# Patient Record
Sex: Male | Born: 1975 | Race: Black or African American | Hispanic: No | Marital: Single | Smoking: Never smoker
Health system: Southern US, Community
[De-identification: ages and names within clinical notes are randomized; demographics above are authoritative.]

---

## 2013-09-14 ENCOUNTER — Encounter (HOSPITAL_BASED_OUTPATIENT_CLINIC_OR_DEPARTMENT_OTHER): Payer: Self-pay | Admitting: Emergency Medicine

## 2013-09-14 ENCOUNTER — Emergency Department (HOSPITAL_BASED_OUTPATIENT_CLINIC_OR_DEPARTMENT_OTHER)
Admission: EM | Admit: 2013-09-14 | Discharge: 2013-09-14 | Disposition: A | Discharge status: Home / Self Care | Payer: BC Managed Care – PPO | Attending: Emergency Medicine | Admitting: Emergency Medicine

## 2013-09-14 DIAGNOSIS — Z008 Encounter for other general examination: Secondary | ICD-10-CM | POA: Insufficient documentation

## 2013-09-14 DIAGNOSIS — Z0000 Encounter for general adult medical examination without abnormal findings: Secondary | ICD-10-CM

## 2013-09-14 NOTE — Discharge Instructions (Signed)
Normal Exam, Adult  You were seen and examined today in our facility. Our caregiver found nothing wrong on the exam. If testing was done such as lab work or x-rays, they did not indicate enough wrong to suggest that treatment should be given. The caregiver then must decide after testing is finished if your concern is a physical problem or illness that needs treatment. Today no treatable problem was found. Even if reassurance was given, if you feel you are getting worse when you get home make sure you call back or return to our department.  For the protection of your privacy, test results can not be given over the phone. Make sure you receive the results of your test. Ask as to how these results are to be obtained if you have not been informed. It is your responsibility to obtain your test results.  Your condition can change over time. Sometimes it takes more than one visit to determine the cause of your symptoms. It is important that you monitor your condition for any changes.  SEEK IMMEDIATE MEDICAL CARE IF:  · You develop an oral temperature above 102° F (38.9° C), which lasts for more than 2 days, not controlled by medications. Only take over-the-counter or prescription medicines for pain, discomfort, or fever as directed by your caregiver.  · You develop a loss of appetite or start throwing up (vomiting).  · You develop a rash, cough, belly (abdominal) pain, earache, headache, or develop pain in neck, muscles, or joints.  · The problem or problems which brought you to our facility become worse or are a cause of more concern.  If we have told you today your exam and tests are normal, and a short while later you feel this is not right, please seek medical attention so you may be rechecked.  Document Released: 11/22/2000 Document Revised: 11/02/2011 Document Reviewed: 03/16/2008  Exit[employer]® Patient Information ©2014 [employer].

## 2013-09-14 NOTE — ED Provider Notes (Signed)
CSN: [id number]     Arrival date & time 09/14/13  2206 History  This chart was scribed for Jared SproutWhitney Dashaun Onstott, MD by Dorothey Basemania Sutton, ED Scribe. This patient was seen in room MH10/MH10 and the patient's care was started at 10:33 PM.    Chief Complaint  Patient presents with  . Circulatory Problem   The history is provided by the patient. No language interpreter was used.   HPI Comments: Jared MartinezMarcus Cole is a 38 y.o. male who presents to the Emergency Department complaining of coldness to the bilateral hands and feet onset around 3.5 hours ago. He reports an associated subjective fever (patient is afebrile at 98.8 in the ED) and states that it has been gradually improving. He states that is co-workers expressed concern for fever/a contagious illness and advised patient to come to the ED for further evaluation. He denies numbness, cough, sore throat, rhinorrhea, myalgias. He denies any regular medication use. He denies history of DM. Patient has no other pertinent medical history. Patient is a non-smoker and an occasional, social drinker.   History reviewed. No pertinent past medical history. History reviewed. No pertinent past surgical history. No family history on file. History  Substance Use Topics  . Smoking status: Never Smoker   . Smokeless tobacco: Not on file  . Alcohol Use: No    Review of Systems  A complete 10 system review of systems was obtained and all systems are negative except as noted in the HPI and PMH.   Allergies  Review of patient's allergies indicates no known allergies.  Home Medications  No current outpatient prescriptions on file.  Triage Vitals: BP 128/88  Pulse 70  Temp(Src) 98.8 F (37.1 C) (Oral)  Resp 16  Ht 6\' 2"  (1.88 m)  Wt 217 lb (98.431 kg)  BMI 27.85 kg/m2  SpO2 100%  Physical Exam  Nursing note and vitals reviewed. Constitutional: He is oriented to person, place, and time. He appears well-developed and well-nourished. No distress.  HENT:  Head:  Normocephalic and atraumatic.  Mouth/Throat: Oropharynx is clear and moist. No oropharyngeal exudate.  Eyes: Conjunctivae are normal.  Neck: Normal range of motion. Neck supple.  Cardiovascular: Normal rate, regular rhythm, normal heart sounds and intact distal pulses.   Pulses:      Dorsalis pedis pulses are 2+ on the right side, and 2+ on the left side.  Pulmonary/Chest: Effort normal and breath sounds normal. No respiratory distress.  Abdominal: Soft. He exhibits no distension. There is no tenderness. There is no rebound and no guarding.  Musculoskeletal: Normal range of motion. He exhibits no edema.  Neurological: He is alert and oriented to person, place, and time.  Skin: Skin is warm and dry.  Psychiatric: He has a normal mood and affect. His behavior is normal.    ED Course  Procedures (including critical care time)  DIAGNOSTIC STUDIES: Oxygen Saturation is 100% on room air, normal by my interpretation.    COORDINATION OF CARE: 10:37 PM- Discussed that there are no signs of illness and that the patient is appropriate for discharge. Discussed treatment plan with patient at bedside and patient verbalized agreement.     Labs Review Labs Reviewed - No data to display Imaging Review No results found.  EKG Interpretation   None       MDM   1. Normal physical exam     Patient presented because he stated that work his hands and feet were cold and some coworkers thought he may have a fever.  However patient has no systemic complaints and states his office was cold in his hands and feet were cold and since he is warmed up he feels fine. His exam is normal his vital signs are normal in given he has no complaints with normal findings the patient was discharged home  I personally performed the services described in this documentation, which was scribed in my presence.  The recorded information has been reviewed and considered.     Jared Sprout, MD 09/14/13 2242

## 2013-09-14 NOTE — ED Notes (Signed)
C/o cold hands and feet onset 1700   Denies pain

## 2013-09-14 NOTE — ED Notes (Addendum)
C/o temp changes to both hands and feet today-was advised by co-workers he has a fver-no temp taken-pt only c/o at this time coldness to both hands and feet-NAD-pt states he was asked to leave work b/c they felt he had a fever and may be contagious

## 2016-12-07 ENCOUNTER — Ambulatory Visit (INDEPENDENT_AMBULATORY_CARE_PROVIDER_SITE_OTHER): Payer: Self-pay

## 2016-12-07 ENCOUNTER — Ambulatory Visit (HOSPITAL_COMMUNITY)
Admission: RE | Admit: 2016-12-07 | Discharge: 2016-12-07 | Disposition: A | Discharge status: Home / Self Care | Payer: BLUE CROSS/BLUE SHIELD | Source: Ambulatory Visit | Attending: Rheumatology | Admitting: Rheumatology

## 2016-12-07 ENCOUNTER — Ambulatory Visit (INDEPENDENT_AMBULATORY_CARE_PROVIDER_SITE_OTHER): Payer: BLUE CROSS/BLUE SHIELD | Admitting: Rheumatology

## 2016-12-07 ENCOUNTER — Encounter: Payer: Self-pay | Admitting: Rheumatology

## 2016-12-07 VITALS — BP 120/74 | HR 78 | Resp 14 | Ht 74.0 in | Wt 198.0 lb

## 2016-12-07 DIAGNOSIS — Z1159 Encounter for screening for other viral diseases: Secondary | ICD-10-CM

## 2016-12-07 DIAGNOSIS — Z1383 Encounter for screening for respiratory disorder NEC: Secondary | ICD-10-CM | POA: Diagnosis not present

## 2016-12-07 DIAGNOSIS — Z111 Encounter for screening for respiratory tuberculosis: Secondary | ICD-10-CM | POA: Diagnosis not present

## 2016-12-07 DIAGNOSIS — M25571 Pain in right ankle and joints of right foot: Secondary | ICD-10-CM | POA: Insufficient documentation

## 2016-12-07 DIAGNOSIS — Z114 Encounter for screening for human immunodeficiency virus [HIV]: Secondary | ICD-10-CM | POA: Diagnosis not present

## 2016-12-07 DIAGNOSIS — M25572 Pain in left ankle and joints of left foot: Secondary | ICD-10-CM | POA: Diagnosis present

## 2016-12-07 DIAGNOSIS — E559 Vitamin D deficiency, unspecified: Secondary | ICD-10-CM

## 2016-12-07 DIAGNOSIS — R5383 Other fatigue: Secondary | ICD-10-CM | POA: Diagnosis not present

## 2016-12-07 DIAGNOSIS — G8929 Other chronic pain: Secondary | ICD-10-CM

## 2016-12-07 NOTE — Progress Notes (Signed)
Office Visit Note  Patient: Jared Cole             Date of Birth: [birth year]           MRN: [id number]             PCP: Pcp Not In System Referring: Lindie Spruce, MD Visit Date: 12/07/2016 Occupation: '@GUAROCC'$ @    Subjective:  Pain of the Right Ankle; Pain of the Left Ankle; and New Patient (Initial Visit)   History of Present Illness: Jared Cole is a 41 y.o. male seen in consultation per request of his PCP. According to patient in January 2018 he started having pain in his bilateral ankles. Gradually the pain got worse and he started having increased swelling. The swelling was initially localized to his ankles and then it moved to his feet. He states he went to his PCP and had x-rays of his bilateral ankles which were normal. He he was working part-time initially and then had to quit working because of increased swelling in his bilateral feet. He is also noticed some discoloration on his bilateral lower extremities. He states he was given meloxicam 15 mg by mouth daily which cause gastritis and weight loss. He has appointment coming up with a gastroenterologist. He denies discomfort in any other joints. He denies any shortness of breath but he gives history of fatigue. Swelling gets worse during the day. Patient gives history of some diarrhea and weight loss which is started after taking Mobic.  Activities of Daily Living:  Patient reports morning stiffness for 0 minute.   Patient Denies nocturnal pain.  Difficulty dressing/grooming: Denies Difficulty climbing stairs: Denies Difficulty getting out of chair: Denies Difficulty using hands for taps, buttons, cutlery, and/or writing: Denies   Review of Systems  Constitutional: Positive for fatigue. Negative for night sweats and weakness ( ).  HENT: Negative for mouth sores, mouth dryness and nose dryness.   Eyes: Negative for redness and dryness.  Respiratory: Negative for shortness of breath and difficulty breathing.     Cardiovascular: Negative for chest pain, palpitations, hypertension, irregular heartbeat and swelling in legs/feet.  Gastrointestinal: Positive for heartburn. Negative for constipation and diarrhea.  Endocrine: Negative for increased urination.  Musculoskeletal: Positive for arthralgias, joint pain and joint swelling. Negative for myalgias, muscle weakness, morning stiffness, muscle tenderness and myalgias.  Skin: Negative for color change, rash, hair loss, nodules/bumps, skin tightness, ulcers and sensitivity to sunlight.       Discoloration  Allergic/Immunologic: Negative for susceptible to infections.  Neurological: Negative for dizziness, fainting, memory loss and night sweats.  Hematological: Negative for swollen glands.  Psychiatric/Behavioral: Positive for depressed mood. Negative for sleep disturbance. The patient is nervous/anxious.        Situational    PMFS History:  There are no active problems to display for this patient.   History reviewed. No pertinent past medical history.  No family history on file. History reviewed. No pertinent surgical history. Social History   Social History Narrative  . No narrative on file     Objective: Vital Signs: BP 120/74 (BP Location: Right Arm)   Pulse 78   Resp 14   Ht '6\' 2"'$  (1.88 m)   Wt 198 lb (89.8 kg)   BMI 25.42 kg/m    Physical Exam  Constitutional: He is oriented to person, place, and time. He appears well-developed and well-nourished.  HENT:  Head: Normocephalic and atraumatic.  Eyes: Conjunctivae and EOM are normal. Pupils are equal, round, and reactive  to light. Left eye exhibits no discharge.  Neck: Normal range of motion. Neck supple.  Cardiovascular: Normal rate, regular rhythm and normal heart sounds.   Pulmonary/Chest: Effort normal and breath sounds normal.  Abdominal: Soft. Bowel sounds are normal.  Neurological: He is alert and oriented to person, place, and time.  Skin: Skin is warm and dry. Capillary  refill takes less than 2 seconds. Rash noted.  Palpable tender erythematous nodules with hyperpigmentation noted on bilateral lower extremities consistent with possible erythema nodosum  Psychiatric: He has a normal mood and affect. His behavior is normal.  Nursing note and vitals reviewed.    Musculoskeletal Exam: C-spine and thoracic lumbar spine good range of motion. Shoulder joints elbow joints wrist joint MCPs PIPs DIPs with good range of motion. Hip joints knee joints are good range of motion. He has some discomfort range of motion of bilateral ankle joints is swelling and tenderness on palpation over bilateral ankle joints and dorsum of foot. No MTP joint tenderness was noted.  CDAI Exam: No CDAI exam completed.    Investigation: No additional findings.   Imaging: Xr Ankle Complete Right  Result Date: 12/07/2016 No ankle joint space was noted. No chondrocalcinosis was noted. No erosive changes were noted. Impression normal x-ray of the right foot  Xr Foot 2 Views Right  Result Date: 12/07/2016 No MTP PIP/DIP joint space narrowing was noted. No intertarsal joint space narrowing was noted. No erosive changes were noted. Impression: Normal x-ray of the right foot   Speciality Comments: No specialty comments available.    Procedures:  No procedures performed Allergies: Patient has no known allergies.   Assessment / Plan:     Visit Diagnoses: Chronic pain of both ankles -pain and swelling of bilateral ankle joints going on since some January 2018. There is no other joint involvement. 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 Differential includes inflammatory arthritis like sarcoidosis or other autoimmune disease. I will obtain following x-rays including a chest x-ray. Following labs will be obtained today to look for underlying autoimmune process. 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 I will refer him to dermatology for biopsy of the nodule.  He gives history of diarrhea and weight loss which is started after Mobic although it raises the concern of possible underlying IBD. Patient has an appointment with GI next Wednesday. I would hold off any treatment until he had GI evaluation.  Vitamin D deficiency    Orders: Orders Placed This Encounter  Procedures  . XR Ankle Complete Left  . XR Ankle Complete Right  . XR Foot 2 Views Left  . XR Foot 2 Views Right  . DG Chest 2 View  . CBC with Differential/Platelet  . COMPLETE METABOLIC PANEL WITH GFR  . Sedimentation rate  . CK  . TSH  . ANA  . Rheumatoid factor  . Angiotensin converting enzyme  . Pan-ANCA  . HLA-B27 antigen  . Hepatitis panel, acute  . HIV antibody  . Quantiferon tb gold assay (blood)  . Serum protein electrophoresis with reflex  . IgG, IgA, IgM  . Ambulatory referral to Dermatology   No orders of the defined types were placed in this encounter.   Face-to-face time spent with patient was 50 minutes. 50% of time was spent in counseling and coordination of care.  Follow-Up Instructions: Return for Pain and swelling ankle joints.  Bo Merino, MD  Note - This record has been created using Editor, commissioning.  Chart creation errors have been sought, but may not always  have been located. Such creation errors do not reflect on  the standard of medical care.

## 2016-12-07 NOTE — Progress Notes (Signed)
CXR normal.

## 2016-12-08 LAB — PAN-ANCA: ANCA SCREEN: NEGATIVE

## 2016-12-08 LAB — CBC WITH DIFFERENTIAL/PLATELET

## 2016-12-08 LAB — COMPLETE METABOLIC PANEL WITH GFR
ALT: 17 U/L (ref 9–46)
AST: 15 U/L (ref 10–40)
Albumin: 3.6 g/dL (ref 3.6–5.1)
Alkaline Phosphatase: 74 U/L (ref 40–115)
BUN: 5 mg/dL — AB (ref 7–25)
CALCIUM: 9.2 mg/dL (ref 8.6–10.3)
CHLORIDE: 100 mmol/L (ref 98–110)
CO2: 20 mmol/L (ref 20–31)
CREATININE: 0.8 mg/dL (ref 0.60–1.35)
GFR, Est African American: >89 mL/min (ref >=60)
GFR, Est Non African American: >89 mL/min (ref >=60)
Glucose, Bld: 118 mg/dL — ABNORMAL HIGH (ref 65–99)
POTASSIUM: 4.2 mmol/L (ref 3.5–5.3)
Sodium: 139 mmol/L (ref 135–146)
Total Bilirubin: 0.8 mg/dL (ref 0.2–1.2)
Total Protein: 7.2 g/dL (ref 6.1–8.1)

## 2016-12-08 LAB — HIV ANTIBODY (ROUTINE TESTING W REFLEX): HIV: NONREACTIVE

## 2016-12-08 LAB — RHEUMATOID FACTOR

## 2016-12-08 LAB — HEPATITIS PANEL, ACUTE
HCV Ab: NEGATIVE
Hep A IgM: NONREACTIVE
Hep B C IgM: NONREACTIVE
Hepatitis B Surface Ag: NEGATIVE

## 2016-12-08 LAB — TSH: TSH: 0.68 mIU/L (ref 0.40–4.50)

## 2016-12-08 LAB — IGG, IGA, IGM
IgA: 145 mg/dL (ref 81–463)
IgG (Immunoglobin G), Serum: 1639 mg/dL — ABNORMAL HIGH (ref 694–1618)
IgM, Serum: 138 mg/dL (ref 48–271)

## 2016-12-08 LAB — CK: Total CK: 183 U/L (ref 7–232)

## 2016-12-08 LAB — SEDIMENTATION RATE: Sed Rate: 38 mm/hr — ABNORMAL HIGH (ref 0–15)

## 2016-12-08 LAB — ANA: ANA: NEGATIVE

## 2016-12-08 LAB — ANGIOTENSIN CONVERTING ENZYME: Angiotensin-Converting Enzyme: 17 U/L (ref 9–67)

## 2016-12-09 LAB — PROTEIN ELECTROPHORESIS, SERUM, WITH REFLEX
ALPHA-2-GLOBULIN: 1.3 g/dL — AB (ref 0.5–0.9)
Albumin ELP: 3.3 g/dL — ABNORMAL LOW (ref 3.8–4.8)
Alpha-1-Globulin: 0.5 g/dL — ABNORMAL HIGH (ref 0.2–0.3)
BETA 2: 0.4 g/dL (ref 0.2–0.5)
BETA GLOBULIN: 0.3 g/dL — AB (ref 0.4–0.6)
GAMMA GLOBULIN: 1.4 g/dL (ref 0.8–1.7)
Total Protein, Serum Electrophoresis: 7.2 g/dL (ref 6.1–8.1)

## 2016-12-09 LAB — QUANTIFERON TB GOLD ASSAY (BLOOD)
INTERFERON GAMMA RELEASE ASSAY: NEGATIVE
MITOGEN-NIL SO: 3.63 [IU]/mL
QUANTIFERON NIL VALUE: 0.04 [IU]/mL
Quantiferon Tb Ag Minus Nil Value: <0 IU/mL

## 2016-12-14 LAB — HLA-B27 ANTIGEN: DNA Result:: NEGATIVE

## 2016-12-14 NOTE — Progress Notes (Signed)
WNL

## 2017-01-05 NOTE — Progress Notes (Deleted)
   Office Visit Note  Patient: Jared Cole             Date of Birth: 04/04/1976           MRN: [id number]             PCP: System, Pcp Not In Referring: No ref. provider found Visit Date: 01/12/2017 Occupation: '@GUAROCC'$ @    Subjective:  No chief complaint on file.   History of Present Illness: Jared Cole is a 41 y.o. male ***   Activities of Daily Living:  Patient reports morning stiffness for *** {minute/hour:19697}.   Patient {ACTIONS;DENIES/REPORTS:21021675::"Denies"} nocturnal pain.  Difficulty dressing/grooming: {ACTIONS;DENIES/REPORTS:21021675::"Denies"} Difficulty climbing stairs: {ACTIONS;DENIES/REPORTS:21021675::"Denies"} Difficulty getting out of chair: {ACTIONS;DENIES/REPORTS:21021675::"Denies"} Difficulty using hands for taps, buttons, cutlery, and/or writing: {ACTIONS;DENIES/REPORTS:21021675::"Denies"}   No Rheumatology ROS completed.   PMFS History:  There are no active problems to display for this patient.   No past medical history on file.  No family history on file. No past surgical history on file. Social History   Social History Narrative  . No narrative on file     Objective: Vital Signs: There were no vitals taken for this visit.   Physical Exam   Musculoskeletal Exam: ***  CDAI Exam: No CDAI exam completed.    Investigation: Findings:  12/07/2016 CMP,IgG, IgA, IgM, ANA, ACE, CK,Hepatitis panel, acute ,HIV antibody ,HLA-B27,Pan-ANCA TB Gold, RF, and TSH are normal   SPEP One or more serum protein fractions are outside the normal ranges. No abnormal protein bands are apparent. ESR elevated 38.  CBC Cancelled, specimen received was clotted.     Imaging: Dg Chest 2 View  Result Date: 12/07/2016 CLINICAL DATA:  Bilateral chronic ankle pain EXAM: CHEST  2 VIEW COMPARISON:  None. FINDINGS: The heart size and mediastinal contours are within normal limits. Both lungs are clear. The visualized skeletal structures are unremarkable.  IMPRESSION: No active cardiopulmonary disease. Electronically Signed   By: Kathreen Devoid   On: 12/07/2016 13:21   Xr Ankle Complete Right  Result Date: 12/07/2016 No ankle joint space was noted. No chondrocalcinosis was noted. No erosive changes were noted. Impression normal x-ray of the right foot  Xr Foot 2 Views Right  Result Date: 12/07/2016 No MTP PIP/DIP joint space narrowing was noted. No intertarsal joint space narrowing was noted. No erosive changes were noted. Impression: Normal x-ray of the right foot   Speciality Comments: No specialty comments available.    Procedures:  No procedures performed Allergies: Patient has no known allergies.   Assessment / Plan:     Visit Diagnoses: Other fatigue  Chronic ankle pain, bilateral    Orders: No orders of the defined types were placed in this encounter.  No orders of the defined types were placed in this encounter.   Face-to-face time spent with patient was *** minutes. 50% of time was spent in counseling and coordination of care.  Follow-Up Instructions: No Follow-up on file.   [name], RT  Note - This record has been created using [employer].  Chart creation errors have been sought, but may not always  have been located. Such creation errors do not reflect on  the standard of medical care.

## 2017-01-06 ENCOUNTER — Ambulatory Visit: Payer: Self-pay | Admitting: Rheumatology

## 2017-01-12 ENCOUNTER — Ambulatory Visit: Payer: Self-pay | Admitting: Rheumatology

## 2017-01-22 DIAGNOSIS — G8929 Other chronic pain: Secondary | ICD-10-CM | POA: Insufficient documentation

## 2017-01-22 DIAGNOSIS — R5383 Other fatigue: Secondary | ICD-10-CM | POA: Insufficient documentation

## 2017-01-22 DIAGNOSIS — M25572 Pain in left ankle and joints of left foot: Principal | ICD-10-CM

## 2017-01-22 DIAGNOSIS — E559 Vitamin D deficiency, unspecified: Secondary | ICD-10-CM | POA: Insufficient documentation

## 2017-01-22 DIAGNOSIS — M25571 Pain in right ankle and joints of right foot: Principal | ICD-10-CM

## 2017-01-22 NOTE — Progress Notes (Signed)
Office Visit Note  Patient: Jared Cole             Date of Birth: May 12, 1976           MRN: [id number]             PCP: System, Pcp Not In Referring: No ref. provider found Visit Date: 01/27/2017 Occupation: @GUAROCC @    Subjective:  Ankle joint pain and swelling   History of Present Illness: Jared Cole is a 41 y.o. male with history of pain and swelling of bilateral ankle joints. He states gradually symptoms got better and the swelling resolved. He also had subcutaneous nodules on his lower extremities for which he went for a skin biopsy. He also had GI workup by Dr. Ferdinand Lango at [facility] in [place]. He states the endoscopy revealed gastric ulcer and he was placed on omeprazole. He continues to have loose stools and some abdominal bloating. He has not had a colonoscopy yet. None of the other joints are painful. Patient states that he also saw urologist who found that his prostate was enlarged and he had mild proteinuria.  Activities of Daily Living:  Patient reports morning stiffness for 0 minutes.   Patient Denies nocturnal pain.  Difficulty dressing/grooming: Denies Difficulty climbing stairs: Denies Difficulty getting out of chair: Denies Difficulty using hands for taps, buttons, cutlery, and/or writing: Denies   Review of Systems  Constitutional: Negative for fatigue, night sweats and weakness ( ).  HENT: Negative for mouth sores, mouth dryness and nose dryness.   Eyes: Negative for redness and dryness.  Respiratory: Negative for shortness of breath and difficulty breathing.   Cardiovascular: Negative for chest pain, palpitations, hypertension, irregular heartbeat and swelling in legs/feet.  Gastrointestinal: Positive for diarrhea. Negative for constipation.  Endocrine: Negative for increased urination.  Musculoskeletal: Negative for arthralgias, joint pain, joint swelling, myalgias, muscle weakness, morning stiffness, muscle tenderness and myalgias.  Skin: Positive  for rash. Negative for color change, hair loss, nodules/bumps, skin tightness, ulcers and sensitivity to sunlight.  Allergic/Immunologic: Negative for susceptible to infections.  Neurological: Negative for dizziness, fainting, memory loss and night sweats.  Hematological: Negative for swollen glands.  Psychiatric/Behavioral: Negative for depressed mood and sleep disturbance. The patient is not nervous/anxious.     PMFS History:  Patient Active Problem List   Diagnosis Date Noted  . Other fatigue 01/22/2017  . Chronic pain of both ankles 01/22/2017  . Vitamin D deficiency 01/22/2017    No past medical history on file.  No family history on file. No past surgical history on file. Social History   Social History Narrative  . No narrative on file     Objective: Vital Signs: BP 128/76   Pulse 80   Resp 14   Wt 183 lb (83 kg)   BMI 23.50 kg/m    Physical Exam  Constitutional: He is oriented to person, place, and time. He appears well-developed and well-nourished.  HENT:  Head: Normocephalic and atraumatic.  Eyes: Conjunctivae and EOM are normal. Pupils are equal, round, and reactive to light.  Neck: Normal range of motion. Neck supple.  Cardiovascular: Normal rate, regular rhythm and normal heart sounds.   Pulmonary/Chest: Effort normal and breath sounds normal.  Abdominal: Soft. Bowel sounds are normal.  Neurological: He is alert and oriented to person, place, and time.  Skin: Skin is warm and dry. Capillary refill takes less than 2 seconds.  Hyperpigmented area over bilateral lower extremities. He had subcutaneous nodules, no active no  diagnosis was noted.  Psychiatric: He has a normal mood and affect. His behavior is normal.  Nursing note and vitals reviewed.    Musculoskeletal Exam: C-spine and thoracic lumbar spine good range of motion. Shoulder joints elbow joints wrist joint MCPs PIPs DIPs are good range of motion. Hip joints knee joints ankle joints are good range of  motion. He has some fullness over bilateral ankle joints but no tenderness on examination.  CDAI Exam: No CDAI exam completed.    Investigation: Findings:  CMP with GFR non fasting glucose 118, IgG 1,639  IgA 145 IgM 138, Sedimentation rate 38, Serum protein electrophoresis with reflex One or more serum protein fractions are outside the normal ranges. No abnormal protein bands are apparent. ANA negative,Angiotensin converting enzyme normal  ,CBC with Differential/Platelet normal ,CK normal,Hepatitis panel, acute negative/ non reactive,HIV antibody non reactive,HLA-B27 antigen negative Pan-ANCA negative,Quantiferon tb gold assay (blood) negative,Rheumatoid factor normal, and TSH normal   Patient brought x-ray of his left ankle joint today which I reviewed and it was within normal limits    Imaging: No results found.  Speciality Comments: No specialty comments available.    Procedures:  No procedures performed Allergies: Patient has no known allergies.   Assessment / Plan:     Visit Diagnoses: Chronic pain of both ankles - high ESR, all other autoimmune workup negative, chest x-ray normal. He still continues to have some swelling without any discomfort. He also brought the x-ray of his left ankle which I reviewed and it was within normal limits. As he is having ongoing ankle joint swelling and had panniculitis, I discussed possible option of using Plaquenil. At this point he would like to hold off as his symptoms are getting better. He'll get his complete GI workup and then we can make decision at follow-up visit. I've given him a handout on Plaquenil. If he has persistent ankle joint swelling and may consider Plaquenil otherwise colchicine could be an option just for the nodules.  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 All autoimmune workup is negative. He's been using compression hoses which  is been helpful. Other medications which are being used for the treatment has been potassium iodide 360-900 mg per day. Or colchicine or steroids sparing agents. There is usually a strong association with tuberculosis his TB gold was negative.  Vitamin D deficiency  Diarrhea, unspecified type - GI workup : Patient had endoscopy and was placed on omeprazole for gastric ulcer per patient a do not have those records to review. He did not get colonoscopy yet. I recommended having a colonoscopy to look for any underlying inflammatory bowel disease.    Orders: Orders Placed This Encounter  Procedures  . CBC with Differential/Platelet   No orders of the defined types were placed in this encounter.   Face-to-face time spent with patient was 30 minutes. 50% of time was spent in counseling and coordination of care.  Follow-Up Instructions: Return in about 2 months (around 03/29/2017) for Pain ankles, EN.   Bo Merino, MD  Note - This record has been created using Editor, commissioning.  Chart creation errors have been sought, but may not always  have been located. Such creation errors do not reflect on  the standard of medical care.

## 2017-01-27 ENCOUNTER — Encounter (INDEPENDENT_AMBULATORY_CARE_PROVIDER_SITE_OTHER): Payer: Self-pay

## 2017-01-27 ENCOUNTER — Encounter: Payer: Self-pay | Admitting: Rheumatology

## 2017-01-27 ENCOUNTER — Ambulatory Visit (INDEPENDENT_AMBULATORY_CARE_PROVIDER_SITE_OTHER): Payer: BLUE CROSS/BLUE SHIELD | Admitting: Rheumatology

## 2017-01-27 VITALS — BP 128/76 | HR 80 | Resp 14 | Wt 183.0 lb

## 2017-01-27 DIAGNOSIS — M25572 Pain in left ankle and joints of left foot: Secondary | ICD-10-CM

## 2017-01-27 DIAGNOSIS — M25571 Pain in right ankle and joints of right foot: Secondary | ICD-10-CM | POA: Diagnosis not present

## 2017-01-27 DIAGNOSIS — R229 Localized swelling, mass and lump, unspecified: Secondary | ICD-10-CM

## 2017-01-27 DIAGNOSIS — R197 Diarrhea, unspecified: Secondary | ICD-10-CM | POA: Diagnosis not present

## 2017-01-27 DIAGNOSIS — G8929 Other chronic pain: Secondary | ICD-10-CM | POA: Diagnosis not present

## 2017-01-27 DIAGNOSIS — E559 Vitamin D deficiency, unspecified: Secondary | ICD-10-CM

## 2017-01-27 DIAGNOSIS — R5383 Other fatigue: Secondary | ICD-10-CM

## 2017-01-27 LAB — CBC WITH DIFFERENTIAL/PLATELET
BASOS ABS: 0 {cells}/uL (ref 0–200)
Basophils Relative: 0 %
EOS ABS: 240 {cells}/uL (ref 15–500)
EOS PCT: 4 %
HEMATOCRIT: 41.3 % (ref 38.5–50.0)
HEMOGLOBIN: 13.5 g/dL (ref 13.2–17.1)
Lymphocytes Relative: 15 %
Lymphs Abs: 900 cells/uL (ref 850–3900)
MCH: 26.3 pg — AB (ref 27.0–33.0)
MCHC: 32.7 g/dL (ref 32.0–36.0)
MCV: 80.5 fL (ref 80.0–100.0)
MPV: 8.6 fL (ref 7.5–12.5)
Monocytes Absolute: 660 cells/uL (ref 200–950)
Monocytes Relative: 11 %
NEUTROS ABS: 4200 {cells}/uL (ref 1500–7800)
NEUTROS PCT: 70 %
Platelets: 403 10*3/uL — ABNORMAL HIGH (ref 140–400)
RBC: 5.13 MIL/uL (ref 4.20–5.80)
RDW: 15.4 % — ABNORMAL HIGH (ref 11.0–15.0)
WBC: 6 10*3/uL (ref 3.8–10.8)

## 2017-01-27 NOTE — Patient Instructions (Signed)
Standing Labs We placed an order today for your standing lab work.    Please come back and get your standing labs in 1 month, then 3 months  We have open lab Monday through Friday from 8:30-11:30 AM and 1:30-4 PM at the office of Dr. Arbutus PedShaili Deveshwar/Naitik Panwala, PA.   The office is located at [address], [address], [place], [place] [address] No appointment is necessary.   Labs are drawn by [facility].  You may receive a bill from [facility] for your lab work. If you have any questions regarding directions or hours of operation,  please call [phone].    Hydroxychloroquine tablets What is this medicine? HYDROXYCHLOROQUINE (hye drox ee KLOR oh kwin) is used to treat rheumatoid arthritis and systemic lupus erythematosus. It is also used to treat malaria. This medicine may be used for other purposes; ask your health care provider or pharmacist if you have questions. COMMON BRAND NAME(S): Plaquenil, Quineprox What should I tell my health care provider before I take this medicine? They need to know if you have any of these conditions: -diabetes -eye disease, vision problems -G6PD deficiency -history of blood diseases -history of irregular heartbeat -if you often drink alcohol -kidney disease -liver disease -porphyria -psoriasis -seizures -an unusual or allergic reaction to chloroquine, hydroxychloroquine, other medicines, foods, dyes, or preservatives -pregnant or trying to get pregnant -breast-feeding How should I use this medicine? Take this medicine by mouth with a glass of water. Follow the directions on the prescription label. Avoid taking antacids within 4 hours of taking this medicine. It is best to separate these medicines by at least 4 hours. Do not cut, crush or chew this medicine. You can take it with or without food. If it upsets your stomach, take it with food. Take your medicine at regular intervals. Do not take your medicine more often than directed. Take all of your  medicine as directed even if you think you are better. Do not skip doses or stop your medicine early. Talk to your pediatrician regarding the use of this medicine in children. While this drug may be prescribed for selected conditions, precautions do apply. Overdosage: If you think you have taken too much of this medicine contact a poison control center or emergency room at once. NOTE: This medicine is only for you. Do not share this medicine with others. What if I miss a dose? If you miss a dose, take it as soon as you can. If it is almost time for your next dose, take only that dose. Do not take double or extra doses. What may interact with this medicine? Do not take this medicine with any of the following medications: -cisapride -dofetilide -dronedarone -live virus vaccines -penicillamine -pimozide -thioridazine -ziprasidone This medicine may also interact with the following medications: -ampicillin -antacids -cimetidine -cyclosporine -digoxin -medicines for diabetes, like insulin, glipizide, glyburide -medicines for seizures like carbamazepine, phenobarbital, phenytoin -mefloquine -methotrexate -other medicines that prolong the QT interval (cause an abnormal heart rhythm) -praziquantel This list may not describe all possible interactions. Give your health care provider a list of all the medicines, herbs, non-prescription drugs, or dietary supplements you use. Also tell them if you smoke, drink alcohol, or use illegal drugs. Some items may interact with your medicine. What should I watch for while using this medicine? Tell your doctor or healthcare professional if your symptoms do not start to get better or if they get worse. Avoid taking antacids within 4 hours of taking this medicine. It is best to  separate these medicines by at least 4 hours. Tell your doctor or health care professional right away if you have any change in your eyesight. Your vision and blood may be tested  before and during use of this medicine. This medicine can make you more sensitive to the sun. Keep out of the sun. If you cannot avoid being in the sun, wear protective clothing and use sunscreen. Do not use sun lamps or tanning beds/booths. What side effects may I notice from receiving this medicine? Side effects that you should report to your doctor or health care professional as soon as possible: -allergic reactions like skin rash, itching or hives, swelling of the face, lips, or tongue -changes in vision -decreased hearing or ringing of the ears -redness, blistering, peeling or loosening of the skin, including inside the mouth -seizures -sensitivity to light -signs and symptoms of a dangerous change in heartbeat or heart rhythm like chest pain; dizziness; fast or irregular heartbeat; palpitations; feeling faint or lightheaded, falls; breathing problems -signs and symptoms of liver injury like dark yellow or brown urine; general ill feeling or flu-like symptoms; light-colored stools; loss of appetite; nausea; right upper belly pain; unusually weak or tired; yellowing of the eyes or skin -signs and symptoms of low blood sugar such as feeling anxious; confusion; dizziness; increased hunger; unusually weak or tired; sweating; shakiness; cold; irritable; headache; blurred vision; fast heartbeat; loss of consciousness -uncontrollable head, mouth, neck, arm, or leg movements Side effects that usually do not require medical attention (report to your doctor or health care professional if they continue or are bothersome): -anxious -diarrhea -dizziness -hair loss -headache -irritable -loss of appetite -nausea, vomiting -stomach pain This list may not describe all possible side effects. Call your doctor for medical advice about side effects. You may report side effects to FDA at 1-800-FDA-1088. Where should I keep my medicine? Keep out of the reach of children. In children, this medicine can cause  overdose with small doses. Store at room temperature between 15 and 30 degrees C (59 and 86 degrees F). Protect from moisture and light. Throw away any unused medicine after the expiration date. NOTE: This sheet is a summary. It may not cover all possible information. If you have questions about this medicine, talk to your doctor, pharmacist, or health care provider.  2018 Elsevier/Gold Standard (2016-03-25 14:16:15)

## 2017-01-27 NOTE — Progress Notes (Signed)
Pharmacy Note  Subjective: Patient presents today to the [facility] to see Dr. Corliss Skainseveshwar.  Patient seen by the pharmacist for counseling on hydroxychloroquine.    Objective: CMP Latest Ref Rng & Units 12/07/2016  Glucose 65 - 99 mg/dL 960(A118(H)  BUN 7 - 25 mg/dL 5(L)  Creatinine 5.400.60 - 1.35 mg/dL 9.810.80  Sodium 191135 - 478146 mmol/L 139  Potassium 3.5 - 5.3 mmol/L 4.2  Chloride 98 - 110 mmol/L 100  CO2 20 - 31 mmol/L 20  Calcium 8.6 - 10.3 mg/dL 9.2  Total Protein 6.1 - 8.1 g/dL 7.2  Total Bilirubin 0.2 - 1.2 mg/dL 0.8  Alkaline Phos 40 - 115 U/L 74  AST 10 - 40 U/L 15  ALT 9 - 46 U/L 17   CBC drawn on 12/07/16 did not results (specimen received clotted), repeat ordered today  Assessment/Plan: Patient was counseled on the purpose, proper use, and adverse effects of hydroxychloroquine including nausea/diarrhea, skin rash, headaches, and sun sensitivity.  Discussed importance of annual eye exams while on hydroxychloroquine to monitor to ocular toxicity and discussed importance of frequent laboratory monitoring.  Provided patient with eye exam form for baseline ophthalmologic exam and standing lab instructions.  Provided patient with educational materials on hydroxychloroquine and answered all questions.  Patient decided he did not want to initiate hydroxychloroquine at this time.  He took home information and will consider in future.    Lilla Shookachel Krisi Azua, Pharm.D., BCPS Clinical Pharmacist Pager: [phone] Phone: [phone] 01/27/2017 10:28 AM

## 2017-01-28 NOTE — Progress Notes (Signed)
WNL

## 2017-01-29 ENCOUNTER — Telehealth: Payer: Self-pay | Admitting: Radiology

## 2017-01-29 NOTE — Telephone Encounter (Signed)
-----   Message from Pollyann SavoyShaili Deveshwar, MD sent at 01/28/2017 12:41 PM EDT ----- WNL

## 2017-01-29 NOTE — Telephone Encounter (Signed)
There are no phone numbers in system for this patient/ if he calls, please get updated phone number/ thanks Jeremiah Tarpley  His CBC is normal/ I would like to let him know.

## 2017-01-29 NOTE — Telephone Encounter (Signed)
I tried the number is saw in his chart [phone], but got the voice mail, so I am not sure if that is his actual number. I will update the number if he calls.  Thank you.

## 2017-03-29 DIAGNOSIS — R229 Localized swelling, mass and lump, unspecified: Secondary | ICD-10-CM | POA: Insufficient documentation

## 2017-03-29 DIAGNOSIS — K279 Peptic ulcer, site unspecified, unspecified as acute or chronic, without hemorrhage or perforation: Secondary | ICD-10-CM | POA: Insufficient documentation

## 2017-03-29 NOTE — Progress Notes (Deleted)
   Office Visit Note  Patient: Jared Cole Granda             Date of Birth: 08/25/1975           MRN: [id number]             PCP: System, Pcp Not In Referring: No ref. provider found Visit Date: 04/01/2017 Occupation: @GUAROCC @    Subjective:  No chief complaint on file.   History of Present Illness: Jared Cole Mcluckie is a 41 y.o. male ***   Activities of Daily Living:  Patient reports morning stiffness for *** {minute/hour:19697}.   Patient {ACTIONS;DENIES/REPORTS:21021675::"Denies"} nocturnal pain.  Difficulty dressing/grooming: {ACTIONS;DENIES/REPORTS:21021675::"Denies"} Difficulty climbing stairs: {ACTIONS;DENIES/REPORTS:21021675::"Denies"} Difficulty getting out of chair: {ACTIONS;DENIES/REPORTS:21021675::"Denies"} Difficulty using hands for taps, buttons, cutlery, and/or writing: {ACTIONS;DENIES/REPORTS:21021675::"Denies"}   No Rheumatology ROS completed.   PMFS History:  Patient Active Problem List   Diagnosis Date Noted  . 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 03/29/2017  . Other fatigue 01/22/2017  . Chronic pain of both ankles 01/22/2017  . Vitamin D deficiency 01/22/2017    No past medical history on file.  No family history on file. No past surgical history on file. Social History   Social History Narrative  . No narrative on file     Objective: Vital Signs: There were no vitals taken for this visit.   Physical Exam   Musculoskeletal Exam: ***  CDAI Exam: No CDAI exam completed.    Investigation: No additional findings. CBC Latest Ref Rng & Units 01/27/2017 12/07/2016  WBC 3.8 - 10.8 K/uL 6.0 SEE NOTE  Hemoglobin 13.2 - 17.1 g/dL 78.213.5 CANCELED  Hematocrit 38.5 - 50.0 % 41.3 CANCELED  Platelets 140 - 400 K/uL 403(H) CANCELED    CMP Latest Ref Rng & Units 12/07/2016  Glucose 65 - 99 mg/dL 956(O118(H)  BUN 7 - 25 mg/dL 5(L)  Creatinine 1.300.60 - 1.35 mg/dL 8.650.80  Sodium 784135 - 696146  mmol/L 139  Potassium 3.5 - 5.3 mmol/L 4.2  Chloride 98 - 110 mmol/L 100  CO2 20 - 31 mmol/L 20  Calcium 8.6 - 10.3 mg/dL 9.2  Total Protein 6.1 - 8.1 g/dL 7.2  Total Bilirubin 0.2 - 1.2 mg/dL 0.8  Alkaline Phos 40 - 115 U/L 74  AST 10 - 40 U/L 15  ALT 9 - 46 U/L 17   Imaging: No results found.  Speciality Comments: No specialty comments available.    Procedures:  No procedures performed Allergies: Patient has no known allergies.   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  Other fatigue  Vitamin D deficiency  Diarrhea, unspecified type    Orders: No orders of the defined types were placed in this encounter.  No orders of the defined types were placed in this encounter.   Face-to-face time spent with patient was *** minutes. 50% of time was spent in counseling and coordination of care.  Follow-Up Instructions: No Follow-up on file.   Amy Littrell, RT  Note - This record has been created using [employer] software.  Chart creation errors have been sought, but may not always  have been located. Such creation errors do not reflect on  the standard of medical care.

## 2017-04-01 ENCOUNTER — Ambulatory Visit: Payer: BLUE CROSS/BLUE SHIELD | Admitting: Rheumatology

## 2019-03-22 IMAGING — DX DG CHEST 2V
2 series · 2 of 2 positions shown · non-contrast
Comparison: None.

CLINICAL DATA: Bilateral chronic ankle pain

EXAM:
CHEST  2 VIEW

[w chest pa]
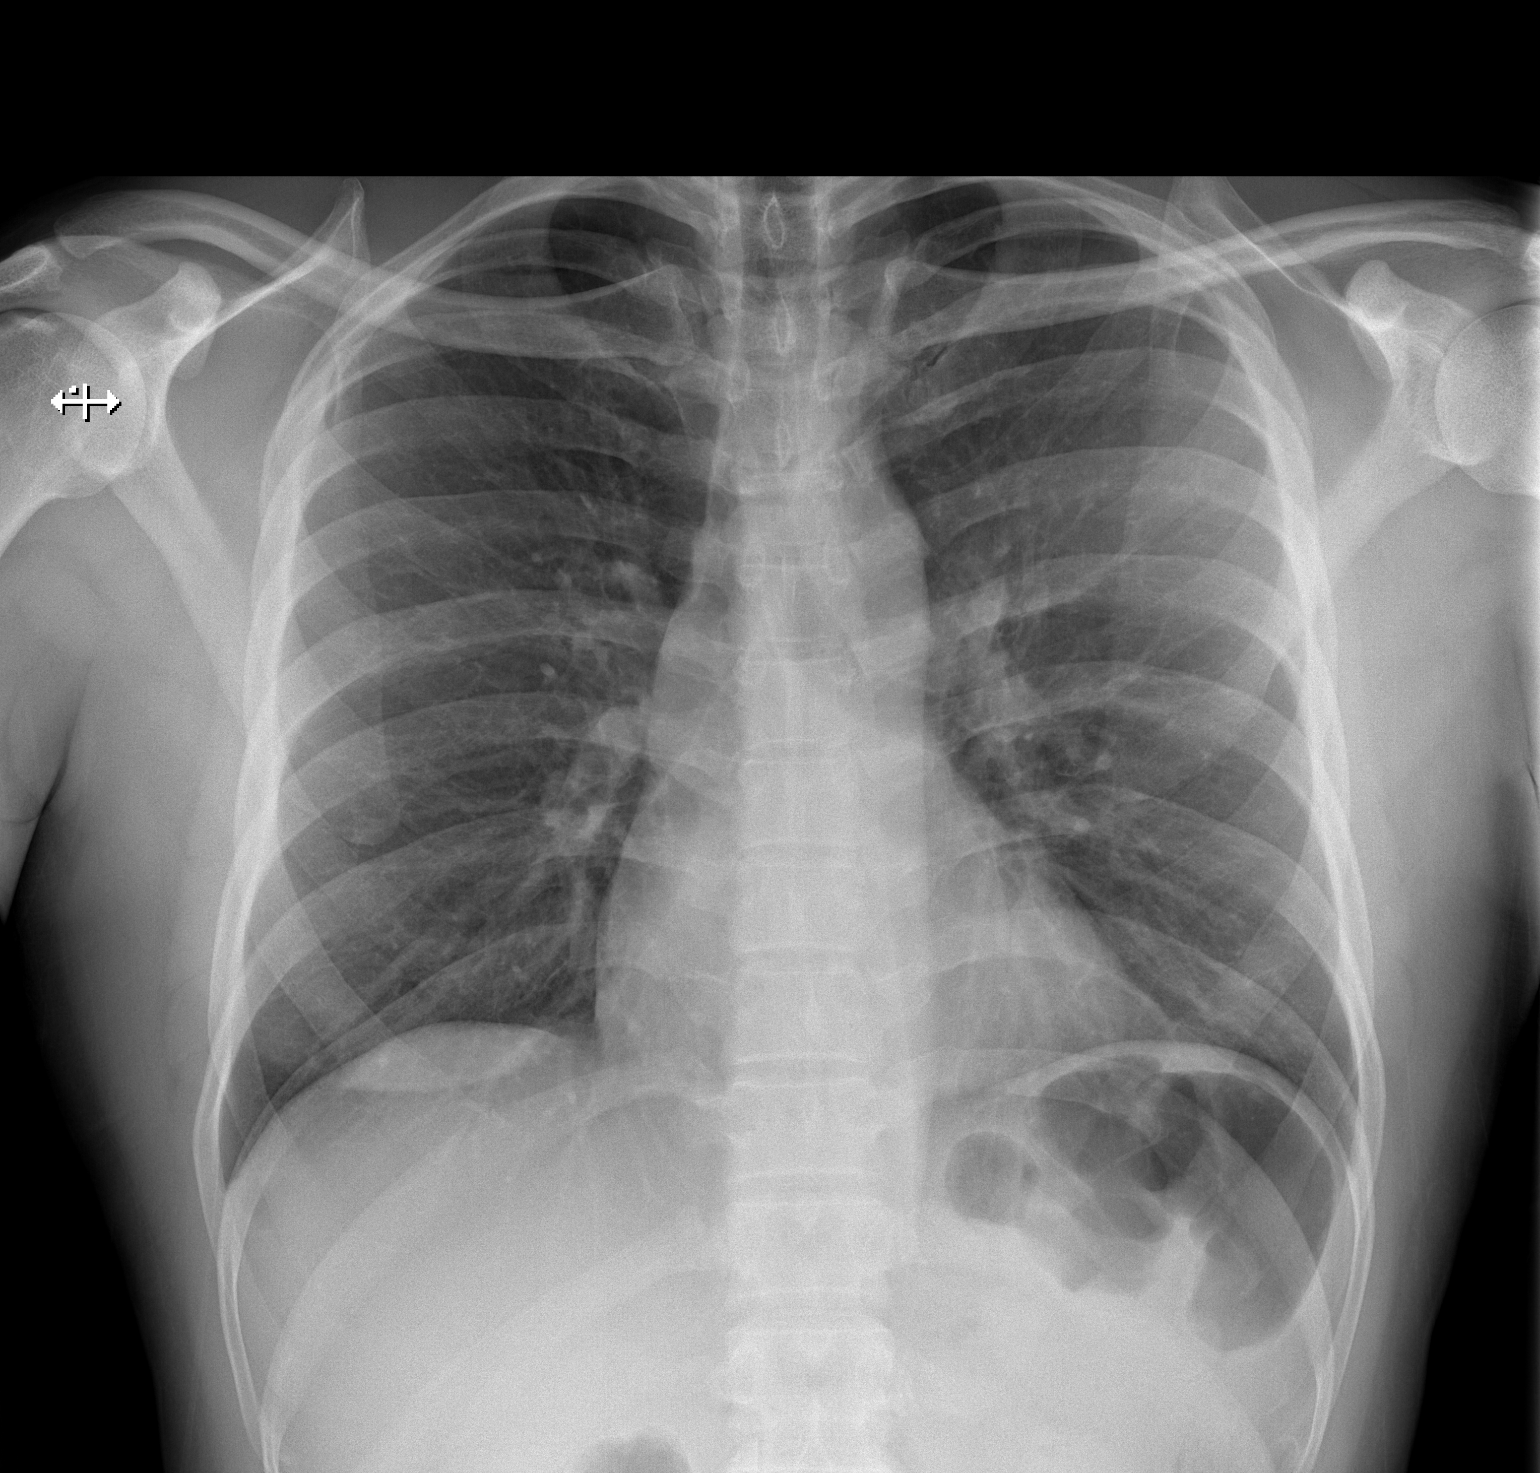

[w chest lat]
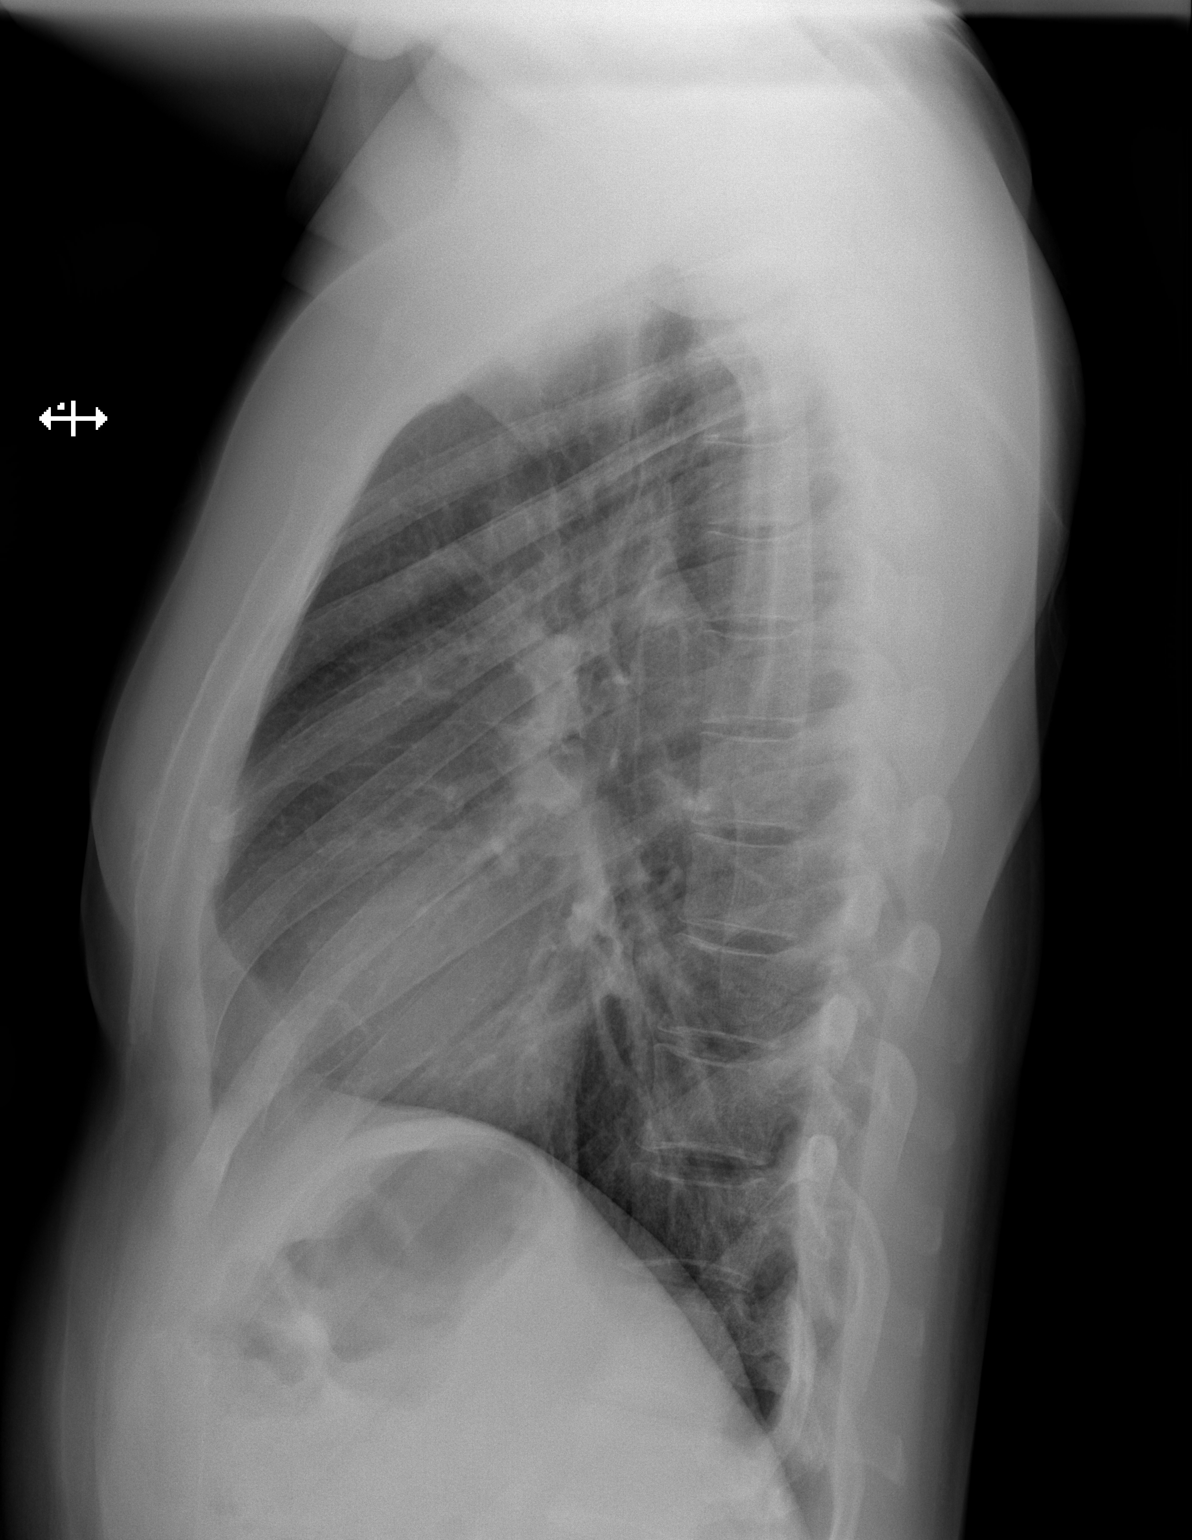

[2 of 2 positions shown; findings below may reference images not displayed]

FINDINGS: The heart size and mediastinal contours are within normal limits.
Both lungs are clear. The visualized skeletal structures are
unremarkable.
IMPRESSION: No active cardiopulmonary disease.

## 2020-05-06 ENCOUNTER — Emergency Department (HOSPITAL_COMMUNITY)
Admission: EM | Admit: 2020-05-06 | Discharge: 2020-05-07 | Disposition: A | Discharge status: Home / Self Care | Payer: 59 | Attending: Emergency Medicine | Admitting: Emergency Medicine

## 2020-05-06 ENCOUNTER — Encounter (HOSPITAL_COMMUNITY): Payer: Self-pay

## 2020-05-06 ENCOUNTER — Other Ambulatory Visit: Payer: Self-pay

## 2020-05-06 DIAGNOSIS — K529 Noninfective gastroenteritis and colitis, unspecified: Secondary | ICD-10-CM | POA: Insufficient documentation

## 2020-05-06 DIAGNOSIS — Z20822 Contact with and (suspected) exposure to covid-19: Secondary | ICD-10-CM | POA: Insufficient documentation

## 2020-05-06 DIAGNOSIS — R823 Hemoglobinuria: Secondary | ICD-10-CM | POA: Insufficient documentation

## 2020-05-06 DIAGNOSIS — R1013 Epigastric pain: Secondary | ICD-10-CM | POA: Diagnosis present

## 2020-05-06 LAB — COMPREHENSIVE METABOLIC PANEL
ALT: 17 U/L (ref 0–44)
AST: 14 U/L — ABNORMAL LOW (ref 15–41)
Albumin: 3.2 g/dL — ABNORMAL LOW (ref 3.5–5.0)
Alkaline Phosphatase: 75 U/L (ref 38–126)
Anion gap: 15 (ref 5–15)
BUN: 13 mg/dL (ref 6–20)
CO2: 25 mmol/L (ref 22–32)
Calcium: 9.1 mg/dL (ref 8.9–10.3)
Chloride: 95 mmol/L — ABNORMAL LOW (ref 98–111)
Creatinine, Ser: 1.05 mg/dL (ref 0.61–1.24)
GFR calc Af Amer: >60 mL/min (ref >60)
GFR calc non Af Amer: >60 mL/min (ref >60)
Glucose, Bld: 113 mg/dL — ABNORMAL HIGH (ref 70–99)
Potassium: 4 mmol/L (ref 3.5–5.1)
Sodium: 135 mmol/L (ref 135–145)
Total Bilirubin: 1.1 mg/dL (ref 0.3–1.2)
Total Protein: 7.7 g/dL (ref 6.5–8.1)

## 2020-05-06 LAB — CBC
HCT: 38.8 % — ABNORMAL LOW (ref 39.0–52.0)
Hemoglobin: 12 g/dL — ABNORMAL LOW (ref 13.0–17.0)
MCH: 24.9 pg — ABNORMAL LOW (ref 26.0–34.0)
MCHC: 30.9 g/dL (ref 30.0–36.0)
MCV: 80.5 fL (ref 80.0–100.0)
Platelets: 775 10*3/uL — ABNORMAL HIGH (ref 150–400)
RBC: 4.82 MIL/uL (ref 4.22–5.81)
RDW: 14.6 % (ref 11.5–15.5)
WBC: 9.8 10*3/uL (ref 4.0–10.5)
nRBC: 0 % (ref 0.0–0.2)

## 2020-05-06 LAB — URINALYSIS, ROUTINE W REFLEX MICROSCOPIC
Glucose, UA: NEGATIVE mg/dL
Ketones, ur: 80 mg/dL — AB
Leukocytes,Ua: NEGATIVE
Nitrite: NEGATIVE
Protein, ur: 30 mg/dL — AB
Specific Gravity, Urine: 1.031 — ABNORMAL HIGH (ref 1.005–1.030)
pH: 5 (ref 5.0–8.0)

## 2020-05-06 LAB — LIPASE, BLOOD: Lipase: 23 U/L (ref 11–51)

## 2020-05-06 NOTE — ED Triage Notes (Signed)
patient c/o mid abdominal pain x 2-3 weeks. Patient states he did have vomiting 2 weeks ago, but none today.

## 2020-05-07 ENCOUNTER — Emergency Department (HOSPITAL_COMMUNITY): Payer: 59

## 2020-05-07 ENCOUNTER — Encounter (HOSPITAL_COMMUNITY): Payer: Self-pay

## 2020-05-07 LAB — SARS CORONAVIRUS 2 BY RT PCR (HOSPITAL ORDER, PERFORMED IN ~~LOC~~ HOSPITAL LAB): SARS Coronavirus 2: NEGATIVE

## 2020-05-07 MED ORDER — IOHEXOL 300 MG/ML  SOLN
100.0000 mL | Freq: Once | INTRAMUSCULAR | Status: AC | PRN
  Administered 2020-05-07: 01:00:00 100 mL via INTRAVENOUS

## 2020-05-07 MED ORDER — SODIUM CHLORIDE 0.9 % IV BOLUS
1000.0000 mL | Freq: Once | INTRAVENOUS | Status: AC
  Administered 2020-05-07: 01:00:00 1000 mL via INTRAVENOUS

## 2020-05-07 MED ORDER — ONDANSETRON HCL 4 MG/2ML IJ SOLN
4.0000 mg | Freq: Once | INTRAMUSCULAR | Status: DC
  Filled 2020-05-07: qty 2

## 2020-05-07 MED ORDER — LACTATED RINGERS IV BOLUS
1000.0000 mL | Freq: Once | INTRAVENOUS | Status: AC
  Administered 2020-05-07: 02:00:00 1000 mL via INTRAVENOUS

## 2020-05-07 MED ORDER — ONDANSETRON 4 MG PO TBDP: 4.0000 mg | ORAL_TABLET | Freq: Three times a day (TID) | ORAL | 0 refills | Status: AC | PRN

## 2020-05-07 NOTE — Discharge Instructions (Addendum)
Thank you for allowing me to care for you today in the Emergency Department.   Please call to schedule a follow-up appointment with Dr. Elnoria Howard with gastroenterology.  You can take 650 mg of Tylenol once every 6 hours as needed for pain.  You can try taking Pepto-Bismol as directed on the label for your symptoms.  Make sure that you are drinking fluids to avoid dehydration.  Let 1 tablet Zofran dissolve under your tongue every 8 hours as needed for nausea or vomiting.  Your urine did have some microscopic blood in it.  Please follow-up and have a repeat urinalysis to ensure that this resolves.  Return to the emergency department if you develop worsening abdominal pain, fevers, uncontrollable vomiting, or other new, concerning symptoms.

## 2020-05-07 NOTE — ED Provider Notes (Signed)
[facility] Provider Note   CSN: [id number] Arrival date & time: 05/06/20  1347     History Chief Complaint  Patient presents with  . Abdominal Pain    Jared Cole is a 44 y.o. male with a history of peptic ulcer disease who presents to the emergency department with a chief complaint of abdominal pain.  The patient reports that he has been having suprapubic abdominal pain that radiates to his bilateral flanks and epigastric abdominal pain for the last 2 weeks.  Symptoms seem to worsen at night when he feels that his abdomen gets more bloated and swollen.  He reports associated anorexia and estimates that he has had approximately a 10 to 15 pound weight loss. States he hasn't been eating solid foods "because he just hasn't had an appetite."  He also reports poor fluid intake "because every time I drink something it just runs right through me."  He has had 1 episode of watery stools almost daily since onset of symptoms.   No fever, chills, nausea, vomiting, constipation, sore throat, dysphagia, neck pain, drooling, shortness of breath, chest pain, cough, choking, dizziness, lightheadedness, dysuria, hematuria, melena, hematochezia, back pain, penile or testicular pain or swelling.   No recent travel.  No recent seafood consumption.  No recent antibiotic use.  No history of similar.  No known sick contacts.  No history of inflammatory bowel disease.  No recent illnesses.  The history is provided by the patient. No language interpreter was used.       History reviewed. No pertinent past medical history.  Patient Active Problem List   Diagnosis Date Noted  . 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 03/29/2017  . PUD (peptic ulcer disease) 03/29/2017  . Other fatigue 01/22/2017  . Chronic pain of both ankles 01/22/2017  . Vitamin D deficiency 01/22/2017    History reviewed.  No pertinent surgical history.     History reviewed. No pertinent family history.  Social History   Tobacco Use  . Smoking status: Never Smoker  . Smokeless tobacco: Never Used  Vaping Use  . Vaping Use: Never used  Substance Use Topics  . Alcohol use: No  . Drug use: No    Home Medications Prior to Admission medications   Medication Sig Start Date End Date Taking? Authorizing Provider  ondansetron (ZOFRAN ODT) 4 MG disintegrating tablet Take 1 tablet (4 mg total) by mouth every 8 (eight) hours as needed. 05/07/20   Camry Robello A, PA-C    Allergies    Patient has no known allergies.  Review of Systems   Review of Systems  Constitutional: Positive for appetite change. Negative for activity change, diaphoresis, fatigue and fever.  HENT: Negative for congestion, sore throat and trouble swallowing.   Eyes: Negative for visual disturbance.  Respiratory: Negative for cough, shortness of breath and wheezing.   Cardiovascular: Negative for chest pain, palpitations and leg swelling.  Gastrointestinal: Positive for abdominal pain and diarrhea. Negative for abdominal distention, anal bleeding, blood in stool, constipation, nausea and vomiting.  Genitourinary: Positive for flank pain. Negative for dysuria.  Musculoskeletal: Negative for arthralgias, back pain, myalgias, neck pain and neck stiffness.  Skin: Negative for rash and wound.  Allergic/Immunologic: Negative for immunocompromised state.  Neurological: Negative for seizures, weakness, numbness and headaches.  Psychiatric/Behavioral: Negative for confusion.    Physical Exam Updated Vital Signs BP 110/73   Pulse (!) 106   Temp 99.7 F (37.6  C) (Oral)   Resp 17   Ht 6\' 2"  (1.88 m)   Wt 68 kg   SpO2 98%   BMI 19.26 kg/m   Physical Exam Vitals and nursing note reviewed.  Constitutional:      General: He is not in acute distress.    Appearance: He is well-developed. He is not ill-appearing, toxic-appearing or  diaphoretic.  HENT:     Head: Normocephalic and atraumatic.     Mouth/Throat:     Mouth: Mucous membranes are dry.     Pharynx: Oropharynx is clear. Uvula midline. No pharyngeal swelling, oropharyngeal exudate, posterior oropharyngeal erythema or uvula swelling.     Tonsils: No tonsillar exudate or tonsillar abscesses.     Comments: Tongue is dry.  Mucous membranes are dry. Eyes:     Conjunctiva/sclera: Conjunctivae normal.     Pupils: Pupils are equal, round, and reactive to light.  Neck:     Trachea: No tracheal deviation.  Cardiovascular:     Rate and Rhythm: Regular rhythm. Tachycardia present.     Pulses: Normal pulses.     Heart sounds: Normal heart sounds. No murmur heard.  No friction rub. No gallop.   Pulmonary:     Effort: Pulmonary effort is normal. No respiratory distress.     Breath sounds: Normal breath sounds. No stridor. No wheezing, rhonchi or rales.  Chest:     Chest wall: No tenderness.  Abdominal:     General: There is no distension.     Palpations: Abdomen is soft. There is no mass.     Tenderness: There is abdominal tenderness. There is no right CVA tenderness, left CVA tenderness, guarding or rebound.     Hernia: No hernia is present.     Comments: Abdomen is soft and not distended.  Generalized tenderness to palpation.  No focal tenderness.  Normoactive bowel sounds.  No CVA tenderness bilaterally.  No tenderness over McBurney's point.  Negative Murphy sign.  Musculoskeletal:        General: Normal range of motion.     Cervical back: Normal range of motion and neck supple.     Right lower leg: No edema.     Left lower leg: No edema.  Skin:    General: Skin is warm and dry.     Capillary Refill: Capillary refill takes 2 to 3 seconds.     Coloration: Skin is not jaundiced or pale.     Findings: No rash.  Neurological:     Mental Status: He is alert and oriented to person, place, and time.  Psychiatric:        Behavior: Behavior normal.     ED  Results / Procedures / Treatments   Labs (all labs ordered are listed, but only abnormal results are displayed) Labs Reviewed  COMPREHENSIVE METABOLIC PANEL - Abnormal; Notable for the following components:      Result Value   Chloride 95 (*)    Glucose, Bld 113 (*)    Albumin 3.2 (*)    AST 14 (*)    All other components within normal limits  CBC - Abnormal; Notable for the following components:   Hemoglobin 12.0 (*)    HCT 38.8 (*)    MCH 24.9 (*)    Platelets 775 (*)    All other components within normal limits  URINALYSIS, ROUTINE W REFLEX MICROSCOPIC - Abnormal; Notable for the following components:   Color, Urine AMBER (*)    Specific Gravity, Urine 1.031 (*)  Hgb urine dipstick MODERATE (*)    Bilirubin Urine SMALL (*)    Ketones, ur 80 (*)    Protein, ur 30 (*)    Bacteria, UA RARE (*)    All other components within normal limits  SARS CORONAVIRUS 2 BY RT PCR (HOSPITAL ORDER, PERFORMED IN [facility])  GASTROINTESTINAL PANEL BY PCR, STOOL (REPLACES STOOL CULTURE)  C DIFFICILE QUICK SCREEN W PCR REFLEX  LIPASE, BLOOD    EKG None  Radiology CT ABDOMEN PELVIS W CONTRAST  Result Date: 05/07/2020 CLINICAL DATA:  Mid abdominal pain times 2-3 weeks. EXAM: CT ABDOMEN AND PELVIS WITH CONTRAST TECHNIQUE: Multidetector CT imaging of the abdomen and pelvis was performed using the standard protocol following bolus administration of intravenous contrast. CONTRAST:  OMNIPAQUE IOHEXOL 300 MG/ML  SOLN COMPARISON:  None. FINDINGS: Lower chest: No acute abnormality. Hepatobiliary: No focal liver abnormality is seen. No gallstones, gallbladder wall thickening, or biliary dilatation. Pancreas: Unremarkable. No pancreatic ductal dilatation or surrounding inflammatory changes. Spleen: Normal in size without focal abnormality. Adrenals/Urinary Tract: Adrenal glands are unremarkable. Kidneys are normal, without renal calculi, focal lesion, or hydronephrosis. Bladder is  unremarkable. Stomach/Bowel: Stomach is within normal limits. The appendix is not clearly identified. No evidence of bowel dilatation. Moderate to marked severity diffuse colonic wall thickening is seen. Vascular/Lymphatic: No significant vascular findings are present. No enlarged abdominal or pelvic lymph nodes. Reproductive: Prostate is unremarkable. Other: No abdominal wall hernia or abnormality. No abdominopelvic ascites. Musculoskeletal: No acute or significant osseous findings. IMPRESSION: Moderate to marked severity diffuse colitis. Electronically Signed   By: Aram Candela M.D.   On: 05/07/2020 01:23    Procedures Procedures (including critical care time)  Medications Ordered in ED Medications  ondansetron (ZOFRAN) injection 4 mg (0 mg Intravenous Hold 05/07/20 0222)  sodium chloride 0.9 % bolus 1,000 mL (0 mLs Intravenous Stopped 05/07/20 0152)  iohexol (OMNIPAQUE) 300 MG/ML solution 100 mL (100 mLs Intravenous Contrast Given 05/07/20 0057)  lactated ringers bolus 1,000 mL (0 mLs Intravenous Stopped 05/07/20 0505)    ED Course  I have reviewed the triage vital signs and the nursing notes.  Pertinent labs & imaging results that were available during my care of the patient were reviewed by me and considered in my medical decision making (see chart for details).    MDM Rules/Calculators/A&P                          44 year old male with a history of peptic ulcer disease presenting with 2 weeks of anorexia, decreased p.o. intake, abdominal pain, watery stools.  No constitutional symptoms.  Afebrile.  Patient was tachycardic on arrival with dry mucous membranes on exam.  Likely secondary to poor p.o. intake.  No leukocytosis.  Hemoglobin is 12, stable from previous.  He also denies bleeding.  Of note, he has a thrombocytosis of 775, up from 403 in 2018, of uncertain significance.  No electrolyte derangements.  Transaminases are normal.  UA with significant ketonuria, mild proteinuria,  elevated specific gravity, moderate hemoglobinuria, and small bilirubinuria.  Will send urine culture.  No AKI.  CT abdomen pelvis obtained with moderate to marked colitis.  No other acute findings.  Patient was discussed with Dr. Wilkie Aye, attending physician.  Have a low suspicion for infectious colitis at this time as patient has had no constitutional symptoms and patient has no leukocytosis.  He is having no urinary complaints.  However, he should follow-up to have  a repeat urinalysis to ensure that hemoglobinuria has resolved.  Regarding colitis, he has had approximately 1 episode of watery diarrhea for the last 2 weeks.  No risk factors for C. difficile.  Doubt vibrio.  He has had no recent travel doubt traveler's diarrhea.  No indication for initiating antibiotics at this time.  We will give the patient close follow-up with gastroenterology for further evaluation.  Patient's tachycardia resolved following 2 L of fluids.  Offered C. difficile and GI stool pathogen testing, but patient was unable to produce a stool sample after several hours.  He reports that he is feeling much improved and is agreeable to outpatient follow-up.  Strict ER return precautions given.  He is hemodynamically stable and in no acute distress.  Safe for discharge to home with outpatient follow-up as indicated.  Final Clinical Impression(s) / ED Diagnoses Final diagnoses:  Colitis  Hemoglobinuria    Rx / DC Orders ED Discharge Orders         Ordered    ondansetron (ZOFRAN ODT) 4 MG disintegrating tablet  Every 8 hours PRN        05/07/20 0434           Frederik Pear A, PA-C 05/07/20 6761    Shon Baton, MD 05/08/20 [phone]
